# Patient Record
Sex: Female | Born: 1990 | Race: Black or African American | Hispanic: No | Marital: Single | Smoking: Current every day smoker
Health system: Southern US, Community
[De-identification: ages and names within clinical notes are randomized; demographics above are authoritative.]

---

## 2011-10-25 ENCOUNTER — Other Ambulatory Visit: Payer: Self-pay

## 2011-10-26 ENCOUNTER — Encounter (HOSPITAL_COMMUNITY): Payer: Self-pay | Admitting: Obstetrics and Gynecology

## 2011-10-30 ENCOUNTER — Other Ambulatory Visit (HOSPITAL_COMMUNITY): Payer: Self-pay | Admitting: Obstetrics and Gynecology

## 2011-10-30 DIAGNOSIS — O2690 Pregnancy related conditions, unspecified, unspecified trimester: Secondary | ICD-10-CM

## 2011-10-30 DIAGNOSIS — Z3689 Encounter for other specified antenatal screening: Secondary | ICD-10-CM

## 2011-11-01 ENCOUNTER — Ambulatory Visit (HOSPITAL_COMMUNITY)
Admission: RE | Admit: 2011-11-01 | Discharge: 2011-11-01 | Disposition: A | Discharge status: Home / Self Care | Payer: Medicaid Other | Source: Ambulatory Visit | Attending: Obstetrics and Gynecology | Admitting: Obstetrics and Gynecology

## 2011-11-01 ENCOUNTER — Encounter (HOSPITAL_COMMUNITY): Payer: Self-pay

## 2011-11-01 DIAGNOSIS — O352XX0 Maternal care for (suspected) hereditary disease in fetus, not applicable or unspecified: Secondary | ICD-10-CM | POA: Insufficient documentation

## 2011-11-01 NOTE — Progress Notes (Signed)
Genetic Counseling  High-Risk Gestation Note  Appointment Date:  11/01/2011 Referred By: Ferman Hamming, MD Date of Birth:  Jun 02, 1990   Pregnancy History: [id number] Estimated Date of Delivery: 05/01/2012 Estimated Gestational Age: [redacted]w[redacted]d Attending: Particia Nearing, MD   [name] was seen for genetic counseling because of a previous daughter with Stickler syndrome.  Both family histories were reviewed and found to be contributory for Stickler syndrome in the patient's daughter, Nancy Daugherty, with a previous partner.  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 [name] reported that her daughter currently has hearing aids, has had surgical repair of cleft palate, and will likely have surgery in the future for her mandible. The patient reported that her daughter does not have vision concerns at this time. [name] reported that she has not followed up with Medical Genetics since the initial visit. No additional relatives were tested for the identified mutation, including [name] or Nancy Daugherty's father. No additional relatives were reported to have features of Stickler syndrome. [name] reported that she is healthy, and that Nancy Daugherty's father has poor vision, but is otherwise healthy. The current pregnancy is with a different partner. The family history was otherwise unremarkable for birth defects, mental retardation, recurrent pregnancy loss, or known genetic conditions.   Stickler syndrome is a connective tissue disorder that is characterized by ocular findings (myopia, cataract, and retinal  detachment), hearing loss, orofacial findings (midfacial underdevelopment and cleft palate with or without Otilio Jefferson sequence), and skeletal findings (mild spondyloepiphyseal dysplasia and/or precocious arthritis).  Otilio Jefferson sequence describes the presence of micrognathia, cleft palate, glossoptosis, and airway obstruction.  Five genes have been reported to be causative for Stickler syndrome. Approximately 80-90% of Stickler syndrome is due to the COL2A1 gene, and approximately 10-20% is due to the COL11A1 gene.  There may be additional causative genes given that some families with a clinical diagnosis of Stickler syndrome do not have identified mutations in the known genes. Stickler syndrome caused by COL11A1 mutations is characterized by typical ocular findings and significant hearing loss. Of note, mutations in the COL11A1 gene have also been reported to cause [facility] syndrome. Marshall syndrome is characterized by ocular, craniofacial, and audiologic findings, as well as short stature and early-onset arthritis. However, given that [facility] syndrome and Stickler syndrome have very similar phenotypes, it is suggested by some that they may be part of the same condition.  Inheritance of Stickler syndrome can be autosomal dominant or autosomal recessive, depending upon the causative gene.   Stickler syndrome caused by COL11A1 mutations follow autosomal dominant inheritance.  We reviewed autosomal dominant inheritance, where an individual with the condition has one copy of a causative gene mutation in the gene pair. This causative gene mutation may be inherited from a parent or can occur sporadically (de novo) in the individual. For individuals with the causative gene mutation, each offspring has a 1 in 2 (50%) chance to inherit the condition. Males and females have equal chances to inherit autosomal dominant conditions. Stickler syndrome is reported to have complete penetrance but variable expressivity.  Given that the COL11A1 gene status of [name] and her  previous partner are not known, it cannot be determined if her daughter inherited the mutation or if it occurred sporadically.  Given that [name] reportedly does not have features of Stickler syndrome, we discussed that it may be more likely that her daughter has a de novo mutation or possibly inherited it from her father (given the report of vision problems, which can be a feature of Stickler syndrome but could also be due to a different etiology). We discussed that genetic testing is available to [name] for the identified COL11A1 gene change identified in her daughter to further refine recurrence risk for the current pregnancy. If [name] also has the same gene mutation, then each pregnancy would have a 1 in 2 (50%) chance to inherit the same gene change. Normal genetic testing for [name] would indicate low recurrence risk for the current and future pregnancies. However, we discussed that the possibility of gonadal mosaicism (where some egg or sperm cells may contain the mutation but other cells do not) could not be ruled out. Thus, in the case that [name] is not found to carry the same COL11A1 mutation, recurrence risk for the current and future pregnancies for Stickler syndrome would be low but increased above the general population risk, given that the possibility of gonadal mosaicism cannot be ruled out.   We discussed that prenatal diagnosis via amniocentesis is available in the current pregnancy to assess for the COL11A1 mutation identified in [name] daughter. We reviewed the risks, benefits, and limitations of amniocentesis. We discussed the approximate 1 in 300-500 risk for complications, including spontaneous pregnancy loss. We also discussed the option of targeted ultrasound in the current pregnancy to assess fetal growth and anatomy. We reviewed that ultrasound can assess for the presence of facial clefts. However, cleft  palate is difficult to visualize on prenatal ultrasound, and ultrasound cannot diagnose or rule out all birth defects. We reviewed that many features of Stickler syndrome would not likely be assess by prenatal ultrasound.  After careful consideration, [name] declined COL11A1 genetic testing for herself and declined amniocentesis in the current pregnancy. She elected to proceed with targeted ultrasound, which is scheduled in our office for 11/29/11. [name] stated that she preferred to wait for postnatal evaluation for Stickler syndrome for the current pregnancy, if warranted.    We discussed sickle cell anemia (SCA) including the carrier frequency and incidence in the African-American population, the availability of carrier testing and prenatal diagnosis if indicated.  In addition, we discussed that hemoglobinopathies are routinely screened for as part of the [place] newborn screening panel.  Her OB medical records indicated that hemoglobin electrophoresis was performed on 10/25/11. Results are currently pending.     [name] denied exposure to environmental toxins or chemical agents. She denied the use of alcohol, tobacco or street drugs. She denied significant viral illnesses during the course of her pregnancy. Her medical and surgical histories were noncontributory.   I counseled [name] for approximately 40 minutes regarding the above risks and available options.     Quinn Plowman, MS,  Certified Genetic Counselor 11/02/2011

## 2011-11-29 ENCOUNTER — Ambulatory Visit (HOSPITAL_COMMUNITY)
Admission: RE | Admit: 2011-11-29 | Discharge: 2011-11-29 | Disposition: A | Discharge status: Home / Self Care | Payer: Medicaid Other | Source: Ambulatory Visit | Attending: Obstetrics and Gynecology | Admitting: Obstetrics and Gynecology

## 2011-11-29 ENCOUNTER — Encounter (HOSPITAL_COMMUNITY): Payer: Self-pay

## 2011-11-29 VITALS — BP 122/62 | HR 86 | Wt 183.8 lb

## 2011-11-29 DIAGNOSIS — Z3689 Encounter for other specified antenatal screening: Secondary | ICD-10-CM

## 2011-11-29 DIAGNOSIS — O352XX0 Maternal care for (suspected) hereditary disease in fetus, not applicable or unspecified: Secondary | ICD-10-CM

## 2011-11-29 DIAGNOSIS — Z363 Encounter for antenatal screening for malformations: Secondary | ICD-10-CM | POA: Insufficient documentation

## 2011-11-29 DIAGNOSIS — O2690 Pregnancy related conditions, unspecified, unspecified trimester: Secondary | ICD-10-CM

## 2011-11-29 DIAGNOSIS — Z1389 Encounter for screening for other disorder: Secondary | ICD-10-CM | POA: Insufficient documentation

## 2011-11-29 DIAGNOSIS — O358XX0 Maternal care for other (suspected) fetal abnormality and damage, not applicable or unspecified: Secondary | ICD-10-CM | POA: Insufficient documentation

## 2011-11-29 NOTE — Progress Notes (Signed)
Nancy Daugherty  was seen today for an ultrasound appointment.  See full report in [facility].  Alpha Gula, MD  [name] is seen today due to previous child with Stickler syndrome.  See note from [employer]film[employer]video.  Normal anatomic fetal survey.  The fetal profile, palate and face appear normal and none of the typical fetal anomalies associated with Stickler syndrome were appreciated.  The patient declined further genetic testing.  Single IUP at 18 0/7 weeks Normal detailed fetal anatomy survey No markers associated with Down syndrome were appreciated Normal amniotic fluid volume  Recommend follow up ultrasound in 6 weeks for interval growth - otherwise routine OB follow up.

## 2012-01-08 ENCOUNTER — Ambulatory Visit (HOSPITAL_COMMUNITY): Admission: RE | Admit: 2012-01-08 | Payer: Medicaid Other | Source: Ambulatory Visit

## 2012-01-23 ENCOUNTER — Ambulatory Visit (HOSPITAL_COMMUNITY)
Admission: RE | Admit: 2012-01-23 | Discharge: 2012-01-23 | Disposition: A | Discharge status: Home / Self Care | Payer: Medicaid Other | Source: Ambulatory Visit | Attending: Obstetrics and Gynecology | Admitting: Obstetrics and Gynecology

## 2012-01-23 ENCOUNTER — Other Ambulatory Visit (HOSPITAL_COMMUNITY): Payer: Self-pay | Admitting: Maternal and Fetal Medicine

## 2012-01-23 VITALS — BP 122/59 | HR 83 | Wt 190.0 lb

## 2012-01-23 DIAGNOSIS — O352XX0 Maternal care for (suspected) hereditary disease in fetus, not applicable or unspecified: Secondary | ICD-10-CM

## 2012-01-23 DIAGNOSIS — Z1389 Encounter for screening for other disorder: Secondary | ICD-10-CM | POA: Insufficient documentation

## 2012-01-23 DIAGNOSIS — Z363 Encounter for antenatal screening for malformations: Secondary | ICD-10-CM | POA: Insufficient documentation

## 2012-01-23 DIAGNOSIS — O358XX0 Maternal care for other (suspected) fetal abnormality and damage, not applicable or unspecified: Secondary | ICD-10-CM | POA: Insufficient documentation

## 2012-02-15 ENCOUNTER — Other Ambulatory Visit (HOSPITAL_COMMUNITY): Payer: Self-pay | Admitting: Maternal and Fetal Medicine

## 2012-02-15 ENCOUNTER — Encounter (HOSPITAL_COMMUNITY): Payer: Self-pay

## 2012-02-15 ENCOUNTER — Ambulatory Visit (HOSPITAL_COMMUNITY)
Admission: RE | Admit: 2012-02-15 | Discharge: 2012-02-15 | Disposition: A | Discharge status: Home / Self Care | Payer: Medicaid Other | Source: Ambulatory Visit | Attending: Obstetrics and Gynecology | Admitting: Obstetrics and Gynecology

## 2012-02-15 VITALS — BP 118/63 | HR 87 | Wt 189.2 lb

## 2012-02-15 DIAGNOSIS — O352XX0 Maternal care for (suspected) hereditary disease in fetus, not applicable or unspecified: Secondary | ICD-10-CM

## 2012-02-15 DIAGNOSIS — Z3689 Encounter for other specified antenatal screening: Secondary | ICD-10-CM | POA: Insufficient documentation

## 2012-02-15 NOTE — Progress Notes (Signed)
Kamyiah Colantonio  was seen today for an ultrasound appointment.  See full report in [facility].  Impression: Single IUP at 29 1/7 weeks Previous child with Stickler syndrome Normal interval anatomy Interval growth is appropriate (33rd %tile); the [facility] measures < 3rd %tile (Asymmetric growth) Normal umbilical artery Doppler studies for gestational age Normal amniotic fluid volume  Recommendations: Recommend follow-up ultrasound examination in 4 weeks for interval growth.  Alpha Gula, MD

## 2012-03-14 ENCOUNTER — Ambulatory Visit (HOSPITAL_COMMUNITY): Payer: Medicaid Other

## 2012-03-24 ENCOUNTER — Ambulatory Visit (HOSPITAL_COMMUNITY): Payer: Medicaid Other | Attending: Obstetrics and Gynecology

## 2012-04-03 ENCOUNTER — Ambulatory Visit (HOSPITAL_COMMUNITY): Payer: Medicaid Other

## 2012-04-03 ENCOUNTER — Inpatient Hospital Stay (HOSPITAL_COMMUNITY): Admission: RE | Admit: 2012-04-03 | Payer: Medicaid Other | Source: Ambulatory Visit

## 2012-04-09 ENCOUNTER — Ambulatory Visit (HOSPITAL_COMMUNITY): Payer: Medicaid Other

## 2012-04-09 ENCOUNTER — Ambulatory Visit (HOSPITAL_COMMUNITY): Payer: Medicaid Other | Attending: Obstetrics and Gynecology

## 2012-10-03 ENCOUNTER — Encounter (HOSPITAL_COMMUNITY): Payer: Self-pay | Admitting: *Deleted

## 2013-02-05 IMAGING — US US OB FOLLOW-UP
1 series · 12 of 28 positions shown · non-contrast
Comparison: none

[Series 1: us ob follow-up · 0.21mm/px · 49 acquisitions, 12 frames shown]
[im 2/49]
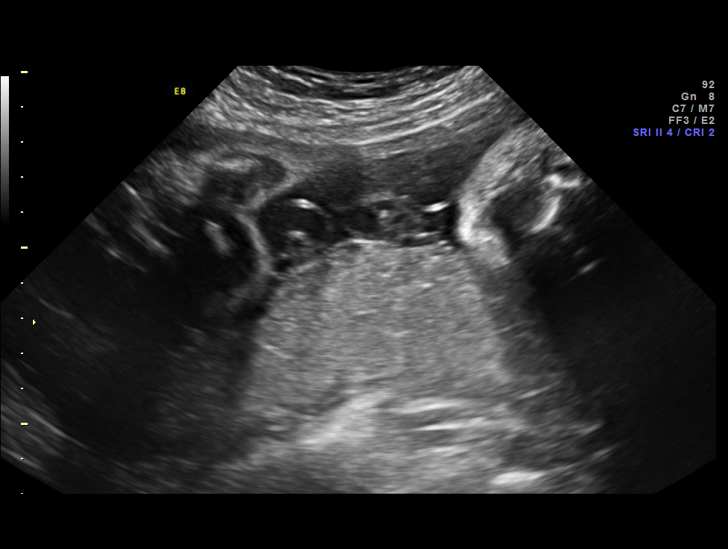
[im 6/49]
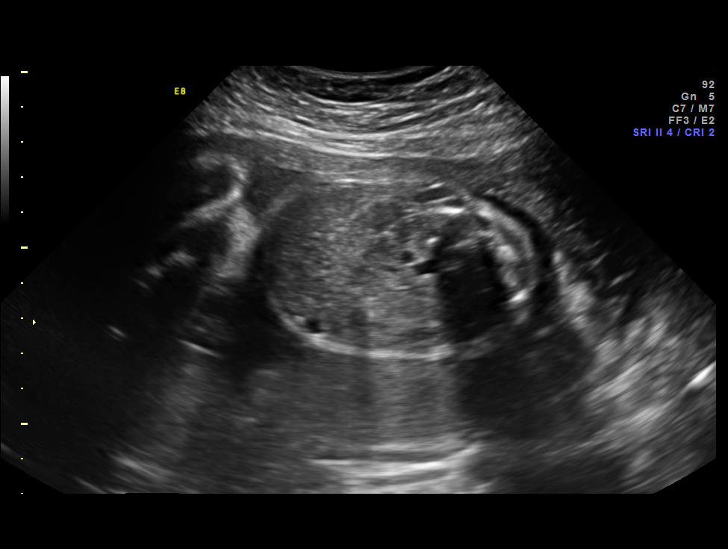
[im 9/49]
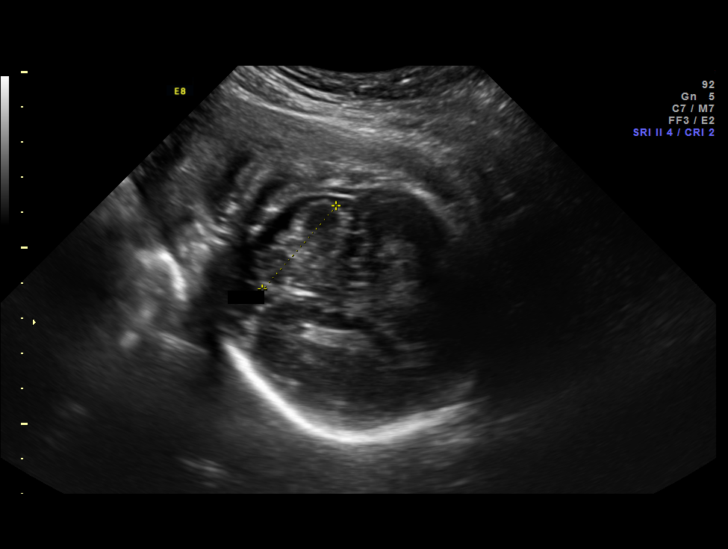
[im 15/49]
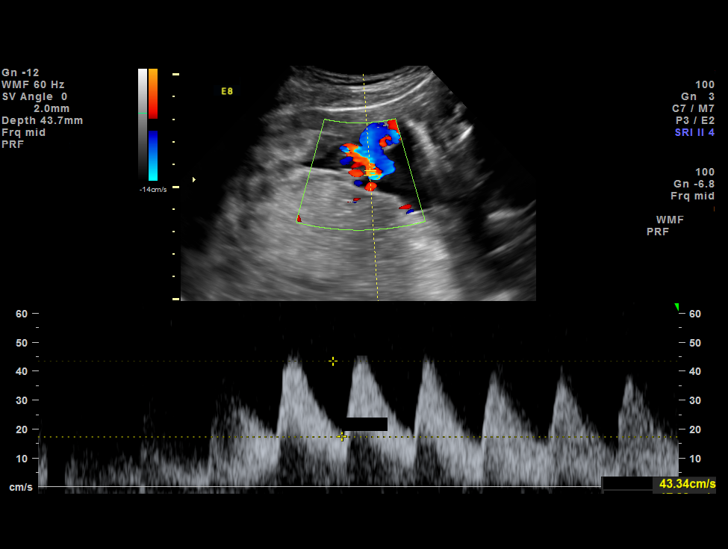
[im 18/49]
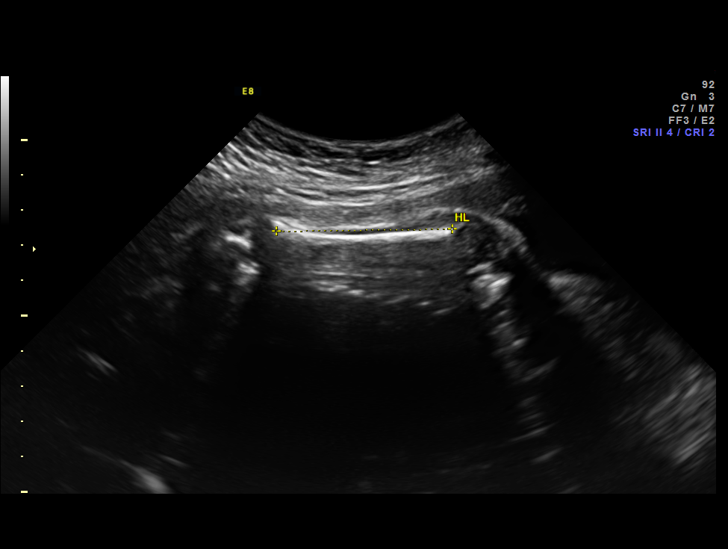
[im 22/49]
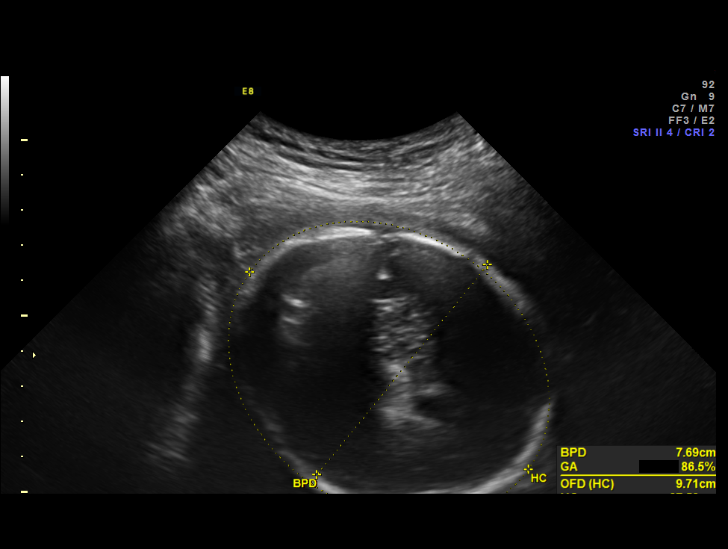
[im 27/49]
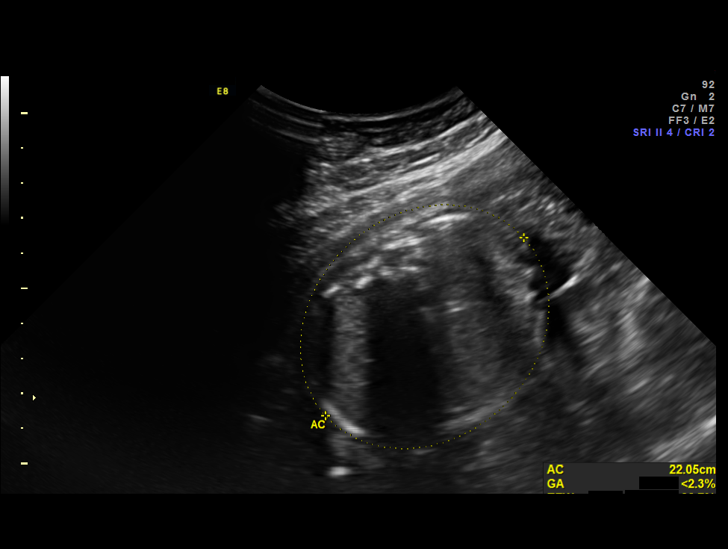
[im 31/49]
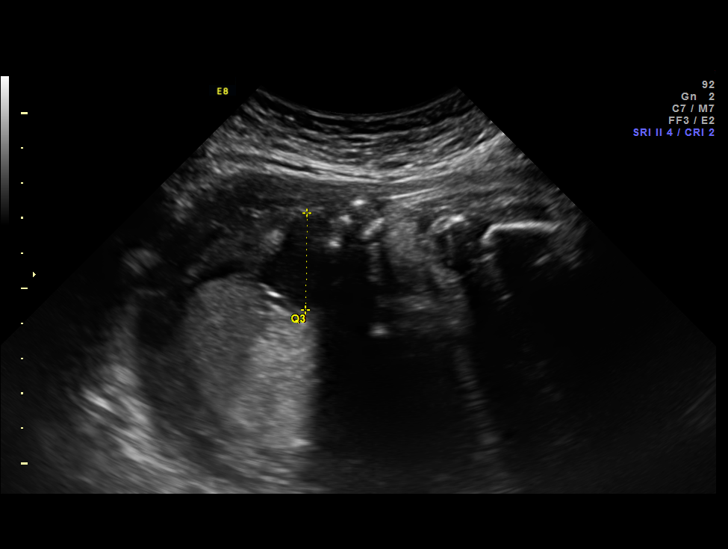
[im 34/49]
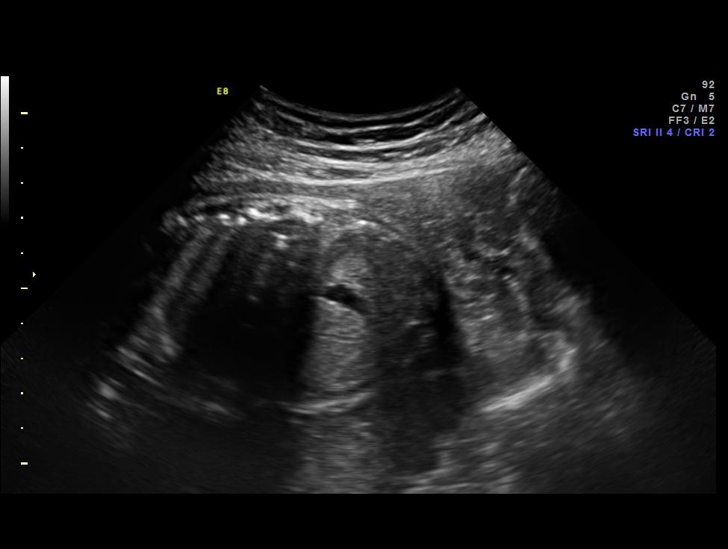
[im 40/49]
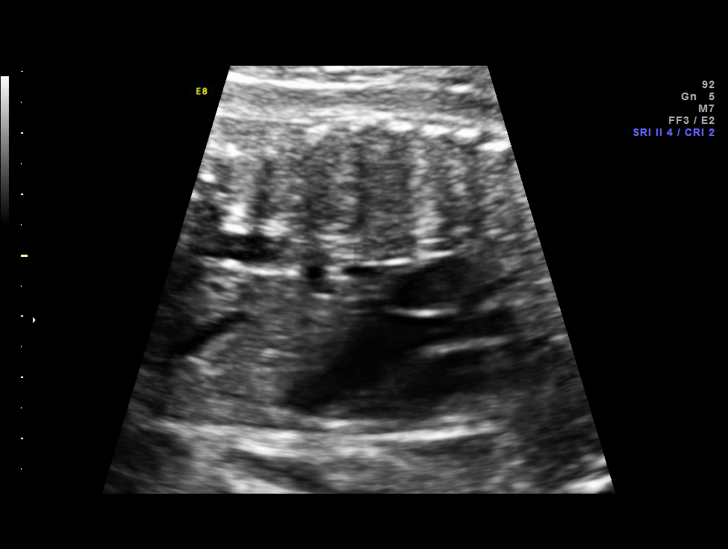
[im 43/49]
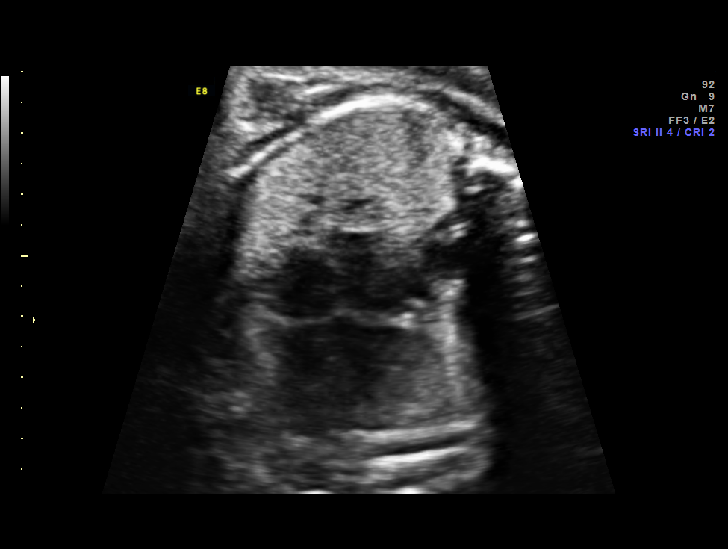
[im 47/49]
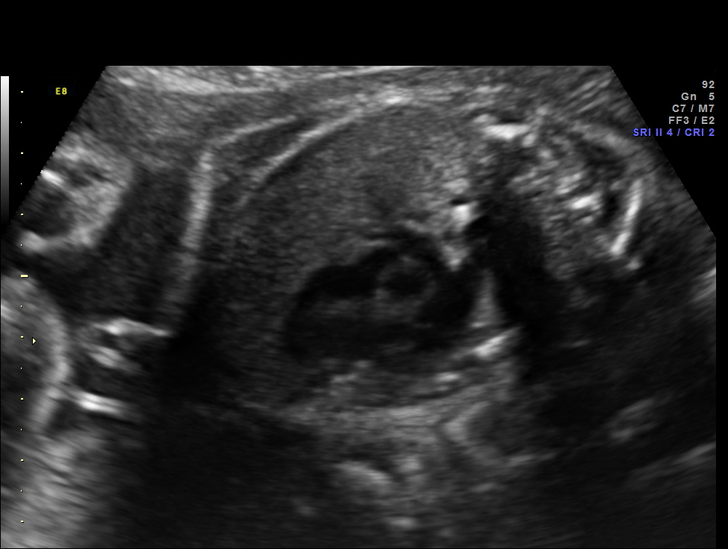

[12 of 28 positions shown; findings below may reference images not displayed]

OBSTETRICS REPORT
                      (Signed Final 02/15/2012 [DATE])

Service(s) Provided

 US OB FOLLOW UP                                       76816.1
 US UA DOPPLER RE-EVAL                                 76828.1
Indications

 History of genetic / anatomic abnormality -
 daughter with Stickler Syndrome
Fetal Evaluation

 Num Of Fetuses:    1
 Fetal Heart Rate:  139                          bpm
 Cardiac Activity:  Observed
 Presentation:      Cephalic
 Placenta:          Posterior, above cervical
                    os

 Amniotic Fluid
 AFI FV:      Subjectively low-normal
 AFI Sum:     9.37    cm        7  %Tile     Larg Pckt:    2.77  cm
 RUQ:   1.81    cm   RLQ:    2.49   cm    LUQ:   2.3     cm   LLQ:    2.77   cm
Biometry

 BPD:     76.2  mm     G. Age:  30w 4d                CI:         78.9   70 - 86
 OFD:     96.6  mm                                    FL/HC:      20.8   19.6 -

 HC:     272.2  mm     G. Age:  29w 5d       33  %    HC/AC:      1.23   0.99 -

 AC:     221.4  mm     G. Age:  26w 4d      < 3  %    FL/BPD:     74.1   71 - 87
 FL:      56.5  mm     G. Age:  29w 5d       51  %    FL/AC:      25.5   20 - 24
 HUM:     50.6  mm     G. Age:  29w 5d       55  %
 CER:     32.1  mm     G. Age:  28w 1d       33  %

 Est. FW:    0007  gm    2 lb 10 oz      33  %
Gestational Age

 U/S Today:     29w 1d                                        EDD:   05/01/12
 Best:          29w 1d     Det. By:  Early Ultrasound         EDD:   05/01/12
                                     (10/23/11)
Anatomy
 Cranium:          Appears normal         Aortic Arch:      Appears normal
 Fetal Cavum:      Previously seen        Ductal Arch:      Previously seen
 Ventricles:       Appears normal         Diaphragm:        Previously seen
 Choroid Plexus:   Appears normal         Stomach:          Appears normal, left
                                                            sided
 Cerebellum:       Appears normal         Abdomen:          Appears normal
 Posterior Fossa:  Appears normal         Abdominal Wall:   Previously seen
 Nuchal Fold:      Previously seen        Cord Vessels:     Previously seen
 Face:             Appears normal         Kidneys:          Appear normal
                   (orbits and profile)
 Lips:             Appears normal         Bladder:          Appears normal
 Palate:           Previously seen        Spine:            Previously seen
 Heart:            Appears normal         Lower             Previously seen
                   (4CH, axis, and        Extremities:
                   situs)
 RVOT:             Appears normal         Upper             Previously seen
                                          Extremities:
 LVOT:             Appears normal

 Other:  Male gender previously seen. Heels previously seen. Nasal bone
         visualized.
Doppler - Fetal Vessels

 Umbilical Artery
 S/D:   2.37           20  %tile       RI:
                                       PSV:       45.15   cm/s
 Umbilical Artery
 Absent DFV:    No     Reverse DFV:    No

Cervix Uterus Adnexa

 Cervix:       Not visualized (advanced GA >48wks)
 Left Ovary:    Previously seen.
 Right Ovary:   Previously seen
Impression

 Single IUP at 29 [DATE] weeks
 Previous child with Stickler syndrome
 Normal interval anatomy
 Interval growth is appropriate (33rd %tile); the AC measures
 < 3rd %tile (Asymmetric growth)
 Normal umbilical artery Doppler studies for gestational age
 Normal amniotic fluid volume
Recommendations

 Recommend follow-up ultrasound examination in 4 weeks for
 interval growth.

 questions or concerns.

## 2013-12-14 ENCOUNTER — Encounter (HOSPITAL_COMMUNITY): Payer: Self-pay | Admitting: *Deleted

## 2019-07-02 ENCOUNTER — Ambulatory Visit
Admission: EM | Admit: 2019-07-02 | Discharge: 2019-07-02 | Disposition: A | Discharge status: Home / Self Care | Payer: Managed Care, Other (non HMO) | Attending: Physician Assistant | Admitting: Physician Assistant

## 2019-07-02 DIAGNOSIS — R059 Cough, unspecified: Secondary | ICD-10-CM

## 2019-07-02 DIAGNOSIS — R05 Cough: Secondary | ICD-10-CM | POA: Diagnosis not present

## 2019-07-02 DIAGNOSIS — Z20822 Contact with and (suspected) exposure to covid-19: Secondary | ICD-10-CM

## 2019-07-02 MED ORDER — ALBUTEROL SULFATE HFA 108 (90 BASE) MCG/ACT IN AERS: 1.0000 | INHALATION_SPRAY | Freq: Four times a day (QID) | RESPIRATORY_TRACT | 0 refills | Status: AC | PRN

## 2019-07-02 MED ORDER — FLUTICASONE PROPIONATE 50 MCG/ACT NA SUSP: 2.0000 | Freq: Every day | NASAL | 0 refills | Status: AC

## 2019-07-02 NOTE — Discharge Instructions (Signed)
COVID PCR testing ordered. I would like you to quarantine until testing results. Albtuerol as needed for shortness of breath/chest tightness. Flonase for eustachian tube dysfunction (bubble behind ear drum). Tylenol/motrin for pain and fever. Keep hydrated, urine should be clear to pale yellow in color. If experiencing significant shortness of breath, trouble breathing, go to the emergency department for further evaluation needed.

## 2019-07-02 NOTE — ED Triage Notes (Signed)
Pt c/o center chest congestion and cough for over 3wks, lost smell 2 days ago. States her friend was covid positive a wk ago and her last contact with her was 2wks ago and states her friend had same sx's. Pt requesting covid testing.

## 2019-07-02 NOTE — ED Provider Notes (Signed)
[facility]    CSN: [id number] Arrival date & time: 07/02/19  0807      History   Chief Complaint Chief Complaint  Patient presents with  . Cough    HPI Nancy Daugherty is a 29 y.o. female.   29 year old female comes in for 3 week history of URI symptoms.  Headaches, chest tightness, shortness of breath, cough. States symptoms have all been intermittent. No symptoms currently except for cough.Denies rhinorrhea, nasal congestion, sore throat. Denies fever, chills, body aches. Denies abdominal pain, nausea, vomiting, diarrhea. Felt that she lost her taste last week, stating her cigarettes did not taste right. Current every smoker, 1pack/week. Positive COVID contact.      History reviewed. No pertinent past medical history.  Patient Active Problem List   Diagnosis Date Noted  . Previous child with congenital anomaly, currently pregnant, antepartum 11/01/2011    History reviewed. No pertinent surgical history.  OB History    Gravida  1   Para      Term      Preterm      AB      Living        SAB      TAB      Ectopic      Multiple      Live Births               Home Medications    Prior to Admission medications   Medication Sig Start Date End Date Taking? Authorizing Provider  albuterol (VENTOLIN HFA) 108 (90 Base) MCG/ACT inhaler Inhale 1-2 puffs into the lungs every 6 (six) hours as needed for wheezing or shortness of breath. 07/02/19   Tasia Catchings, Amor Packard V, PA-C  fluticasone (FLONASE) 50 MCG/ACT nasal spray Place 2 sprays into both nostrils daily. 07/02/19   Ok Edwards, PA-C    Family History Family History  Problem Relation Age of Onset  . Stickler syndrome Daughter        COL11A1 mutation    Social History Social History   Tobacco Use  . Smoking status: Current Every Day Smoker  . Smokeless tobacco: Never Used  Substance Use Topics  . Alcohol use: Yes    Comment: social  . Drug use: Never     Allergies   Patient has no known  allergies.   Review of Systems Review of Systems  Reason unable to perform ROS: See HPI as above.     Physical Exam Triage Vital Signs ED Triage Vitals [07/02/19 0818]  Enc Vitals Group     BP 130/74     Pulse Rate 74     Resp 16     Temp 99.2 F (37.3 C)     Temp Source Oral     SpO2 97 %     Weight      Height      Head Circumference      Peak Flow      Pain Score 0     Pain Loc      Pain Edu?      Excl. in [place]?    No data found.  Updated Vital Signs BP 130/74 (BP Location: Left Arm)   Pulse 74   Temp 99.2 F (37.3 C) (Oral)   Resp 16   SpO2 97%   Breastfeeding No   Physical Exam Constitutional:      General: She is not in acute distress.    Appearance: Normal appearance. She is not  ill-appearing, toxic-appearing or diaphoretic.  HENT:     Head: Normocephalic and atraumatic.     Right Ear: Ear canal and external ear normal. A middle ear effusion is present.     Left Ear: Ear canal and external ear normal. A middle ear effusion is present.     Mouth/Throat:     Mouth: Mucous membranes are moist.     Pharynx: Oropharynx is clear. Uvula midline.  Cardiovascular:     Rate and Rhythm: Normal rate and regular rhythm.     Heart sounds: Normal heart sounds. No murmur. No friction rub. No gallop.   Pulmonary:     Effort: Pulmonary effort is normal. No accessory muscle usage, prolonged expiration, respiratory distress or retractions.     Comments: Lungs clear to auscultation without adventitious lung sounds. Musculoskeletal:     Cervical back: Normal range of motion and neck supple.  Neurological:     General: No focal deficit present.     Mental Status: She is alert and oriented to person, place, and time.     UC Treatments / Results  Labs (all labs ordered are listed, but only abnormal results are displayed) Labs Reviewed  NOVEL CORONAVIRUS, NAA    EKG   Radiology No results found.  Procedures Procedures (including critical care  time)  Medications Ordered in UC Medications - No data to display  Initial Impression / Assessment and Plan / UC Course  I have reviewed the triage vital signs and the nursing notes.  Pertinent labs & imaging results that were available during my care of the patient were reviewed by me and considered in my medical decision making (see chart for details).    COVID PCR test ordered. Patient to quarantine until testing results return.  Patient with intermittent symptoms in the past 3 weeks, currently only with cough.  If Covid test positive, may need to quarantine for 10 days given unsure of when symptom onset.    No alarming signs on exam.  Patient speaking in full sentences without respiratory distress.  Symptomatic treatment discussed.  Push fluids.  Return precautions given.  Patient expresses understanding and agrees to plan.  Final Clinical Impressions(s) / UC Diagnoses   Final diagnoses:  Cough  Close exposure to COVID-19 virus   ED Prescriptions    Medication Sig Dispense Auth. Provider   albuterol (VENTOLIN HFA) 108 (90 Base) MCG/ACT inhaler Inhale 1-2 puffs into the lungs every 6 (six) hours as needed for wheezing or shortness of breath. 18 g Korynne Dols V, PA-C   fluticasone (FLONASE) 50 MCG/ACT nasal spray Place 2 sprays into both nostrils daily. 1 g Belinda Fisher, PA-C     PDMP not reviewed this encounter.   Belinda Fisher, PA-C 07/02/19 [phone]

## 2019-07-03 LAB — SARS-COV-2, NAA 2 DAY TAT

## 2019-07-03 LAB — NOVEL CORONAVIRUS, NAA: SARS-CoV-2, NAA: NOT DETECTED

## 2019-10-10 ENCOUNTER — Other Ambulatory Visit: Payer: Self-pay

## 2019-10-10 ENCOUNTER — Encounter: Payer: Self-pay | Admitting: Emergency Medicine

## 2019-10-10 ENCOUNTER — Ambulatory Visit
Admission: EM | Admit: 2019-10-10 | Discharge: 2019-10-10 | Disposition: A | Discharge status: Home / Self Care | Payer: Managed Care, Other (non HMO) | Attending: Emergency Medicine | Admitting: Emergency Medicine

## 2019-10-10 DIAGNOSIS — N760 Acute vaginitis: Secondary | ICD-10-CM | POA: Insufficient documentation

## 2019-10-10 DIAGNOSIS — L309 Dermatitis, unspecified: Secondary | ICD-10-CM | POA: Insufficient documentation

## 2019-10-10 DIAGNOSIS — B9689 Other specified bacterial agents as the cause of diseases classified elsewhere: Secondary | ICD-10-CM | POA: Insufficient documentation

## 2019-10-10 LAB — POCT URINALYSIS DIP (MANUAL ENTRY)
Bilirubin, UA: NEGATIVE
Glucose, UA: NEGATIVE mg/dL
Ketones, POC UA: NEGATIVE mg/dL
Nitrite, UA: NEGATIVE
Protein Ur, POC: NEGATIVE mg/dL
Spec Grav, UA: >=1.03 — AB (ref 1.010–1.025)
Urobilinogen, UA: 0.2 E.U./dL
pH, UA: 6 (ref 5.0–8.0)

## 2019-10-10 MED ORDER — TRIAMCINOLONE ACETONIDE 0.5 % EX OINT: 1.0000 "application " | TOPICAL_OINTMENT | Freq: Two times a day (BID) | CUTANEOUS | 0 refills | Status: AC

## 2019-10-10 MED ORDER — METRONIDAZOLE 500 MG PO TABS: 500.0000 mg | ORAL_TABLET | Freq: Two times a day (BID) | ORAL | 0 refills | Status: AC

## 2019-10-10 NOTE — Discharge Instructions (Addendum)
Today you received treatment for BV. Testing for chlamydia, gonorrhea, trichomonas is pending: please look for these results on the MyChart app/website.  We will notify you if you are positive and outline treatment at that time.  Important to avoid all forms of sexual intercourse (oral, vaginal, anal) with any/all partners for the next 7 days to avoid spreading/reinfecting. Any/all sexual partners should be notified of testing/treatment today.  Return for persistent/worsening symptoms or if you develop fever, abdominal or pelvic pain, discharge, genital pain, blood in your urine, or are re-exposed to an STI.

## 2019-10-10 NOTE — ED Triage Notes (Signed)
Pt here for lower abd pain; pt sts pain with urination and some vaginal discharge with odor

## 2019-10-10 NOTE — ED Provider Notes (Signed)
[facility]    CSN: [id number] Arrival date & time: 10/10/19  1149      History   Chief Complaint Chief Complaint  Patient presents with  . Abdominal Pain  . Vaginal Discharge    HPI Nancy Daugherty is a 29 y.o. female with history of BV presenting for BV flare.  Endorsing lower abdominal cramping, vaginal discharge with malodor.  States it feels like BV.  Currently sexually active with more than 1 female partner in the last 6 months.  Denies pelvic pain, vaginal bleeding.  States that she has some irritation with urination, though states this feels different than previous UTIs.  Denies urinary frequency, urgency, history of renal calculi.  No pruritus.    History reviewed. No pertinent past medical history.  Patient Active Problem List   Diagnosis Date Noted  . Previous child with congenital anomaly, currently pregnant, antepartum 11/01/2011    History reviewed. No pertinent surgical history.  OB History    Gravida  1   Para      Term      Preterm      AB      Living        SAB      TAB      Ectopic      Multiple      Live Births               Home Medications    Prior to Admission medications   Medication Sig Start Date End Date Taking? Authorizing Provider  albuterol (VENTOLIN HFA) 108 (90 Base) MCG/ACT inhaler Inhale 1-2 puffs into the lungs every 6 (six) hours as needed for wheezing or shortness of breath. 07/02/19   Cathie Hoops, Amy V, PA-C  fluticasone (FLONASE) 50 MCG/ACT nasal spray Place 2 sprays into both nostrils daily. 07/02/19   Cathie Hoops, Amy V, PA-C  metroNIDAZOLE (FLAGYL) 500 MG tablet Take 1 tablet (500 mg total) by mouth 2 (two) times daily. 10/10/19   Hall-Potvin, [place], PA-C  triamcinolone ointment (KENALOG) 0.5 % Apply 1 application topically 2 (two) times daily. 10/10/19   Hall-Potvin, [place], PA-C    Family History Family History  Problem Relation Age of Onset  . Stickler syndrome Daughter        [id number] mutation     Social History Social History   Tobacco Use  . Smoking status: Current Every Day Smoker  . Smokeless tobacco: Never Used  Substance Use Topics  . Alcohol use: Yes    Comment: social  . Drug use: Never     Allergies   Patient has no known allergies.   Review of Systems As per HPI   Physical Exam Triage Vital Signs ED Triage Vitals  Enc Vitals Group     BP 10/10/19 1254 119/74     Pulse Rate 10/10/19 1254 69     Resp 10/10/19 1254 18     Temp 10/10/19 1254 98.6 F (37 C)     Temp Source 10/10/19 1254 Oral     SpO2 10/10/19 1254 98 %     Weight --      Height --      Head Circumference --      Peak Flow --      Pain Score 10/10/19 1255 5     Pain Loc --      Pain Edu? --      Excl. in GC? --    No data found.  Updated Vital Signs BP 119/74 (  BP Location: Right Arm)   Pulse 69   Temp 98.6 F (37 C) (Oral)   Resp 18   SpO2 98%   Visual Acuity Right Eye Distance:   Left Eye Distance:   Bilateral Distance:    Right Eye Near:   Left Eye Near:    Bilateral Near:     Physical Exam Constitutional:      General: She is not in acute distress. HENT:     Head: Normocephalic and atraumatic.  Eyes:     General: No scleral icterus.    Pupils: Pupils are equal, round, and reactive to light.  Cardiovascular:     Rate and Rhythm: Normal rate.  Pulmonary:     Effort: Pulmonary effort is normal.  Abdominal:     General: Bowel sounds are normal.     Palpations: Abdomen is soft.     Tenderness: There is no abdominal tenderness. There is no right CVA tenderness, left CVA tenderness or guarding.  Genitourinary:    Comments: Patient declined, self-swab performed Skin:    Coloration: Skin is not jaundiced or pale.  Neurological:     Mental Status: She is alert and oriented to person, place, and time.      UC Treatments / Results  Labs (all labs ordered are listed, but only abnormal results are displayed) Labs Reviewed  POCT URINALYSIS DIP (MANUAL  ENTRY) - Abnormal; Notable for the following components:      Result Value   Spec Grav, UA >=1.030 (*)    Blood, UA trace-intact (*)    Leukocytes, UA Trace (*)    All other components within normal limits  URINE CULTURE  CERVICOVAGINAL ANCILLARY ONLY    EKG   Radiology No results found.  Procedures Procedures (including critical care time)  Medications Ordered in UC Medications - No data to display  Initial Impression / Assessment and Plan / UC Course  I have reviewed the triage vital signs and the nursing notes.  Pertinent labs & imaging results that were available during my care of the patient were reviewed by me and considered in my medical decision making (see chart for details).     Urine with trace leukocytes, blood, elevated specific gravity.  Culture pending.  Will wait for culture prior to treating for UTI as patient feels this is more consistent with BV.  Flagyl sent.  Cytology pending: We will treat if indicated.  Return precautions discussed, pt verbalized understanding and is agreeable to plan. Final Clinical Impressions(s) / UC Diagnoses   Final diagnoses:  BV (bacterial vaginosis)  Eczema, unspecified type     Discharge Instructions     Today you received treatment for BV. Testing for chlamydia, gonorrhea, trichomonas is pending: please look for these results on the MyChart app/website.  We will notify you if you are positive and outline treatment at that time.  Important to avoid all forms of sexual intercourse (oral, vaginal, anal) with any/all partners for the next 7 days to avoid spreading/reinfecting. Any/all sexual partners should be notified of testing/treatment today.  Return for persistent/worsening symptoms or if you develop fever, abdominal or pelvic pain, discharge, genital pain, blood in your urine, or are re-exposed to an STI.    ED Prescriptions    Medication Sig Dispense Auth. Provider   metroNIDAZOLE (FLAGYL) 500 MG tablet Take 1  tablet (500 mg total) by mouth 2 (two) times daily. 14 tablet Hall-Potvin, [place], PA-C   triamcinolone ointment (KENALOG) 0.5 % Apply 1 application topically 2 (  two) times daily. 30 g Hall-Potvin, [place], PA-C     PDMP not reviewed this encounter.   Odette Fraction [place], [place] 10/10/19 1341

## 2019-10-13 LAB — CERVICOVAGINAL ANCILLARY ONLY
Chlamydia: NEGATIVE
Comment: NEGATIVE
Comment: NEGATIVE
Comment: NORMAL
Neisseria Gonorrhea: NEGATIVE
Trichomonas: POSITIVE — AB

## 2019-10-13 LAB — URINE CULTURE: Culture: NO GROWTH
